# Patient Record
Sex: Female | Born: 1995 | Race: White | Hispanic: No | Marital: Single | Smoking: Former smoker
Health system: Southern US, Community
[De-identification: ages and names within clinical notes are randomized; demographics above are authoritative.]

---
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
## 2018-06-06 ENCOUNTER — Emergency Department (HOSPITAL_COMMUNITY)
Admission: EM | Admit: 2018-06-06 | Discharge: 2018-06-06 | Disposition: A | Discharge status: Home / Self Care | Payer: BLUE CROSS/BLUE SHIELD | Attending: Emergency Medicine | Admitting: Emergency Medicine

## 2018-06-06 ENCOUNTER — Other Ambulatory Visit: Payer: Self-pay

## 2018-06-06 ENCOUNTER — Encounter (HOSPITAL_COMMUNITY): Payer: Self-pay | Admitting: Emergency Medicine

## 2018-06-06 DIAGNOSIS — F17200 Nicotine dependence, unspecified, uncomplicated: Secondary | ICD-10-CM | POA: Diagnosis not present

## 2018-06-06 DIAGNOSIS — Y9289 Other specified places as the place of occurrence of the external cause: Secondary | ICD-10-CM | POA: Diagnosis not present

## 2018-06-06 DIAGNOSIS — W57XXXA Bitten or stung by nonvenomous insect and other nonvenomous arthropods, initial encounter: Secondary | ICD-10-CM | POA: Diagnosis not present

## 2018-06-06 DIAGNOSIS — S70361A Insect bite (nonvenomous), right thigh, initial encounter: Secondary | ICD-10-CM | POA: Insufficient documentation

## 2018-06-06 DIAGNOSIS — Y998 Other external cause status: Secondary | ICD-10-CM | POA: Insufficient documentation

## 2018-06-06 DIAGNOSIS — Y9389 Activity, other specified: Secondary | ICD-10-CM | POA: Insufficient documentation

## 2018-06-06 MED ORDER — IBUPROFEN 800 MG PO TABS
800.0000 mg | ORAL_TABLET | Freq: Once | ORAL | Status: AC
  Administered 2018-06-06: 800 mg via ORAL
  Filled 2018-06-06: qty 1

## 2018-06-06 MED ORDER — IBUPROFEN 800 MG PO TABS: 800.0000 mg | ORAL_TABLET | Freq: Three times a day (TID) | ORAL | 0 refills | Status: AC

## 2018-06-06 MED ORDER — SULFAMETHOXAZOLE-TRIMETHOPRIM 800-160 MG PO TABS: 1.0000 | ORAL_TABLET | Freq: Two times a day (BID) | ORAL | 0 refills | Status: AC

## 2018-06-06 MED ORDER — SULFAMETHOXAZOLE-TRIMETHOPRIM 800-160 MG PO TABS
1.0000 | ORAL_TABLET | Freq: Once | ORAL | Status: AC
  Administered 2018-06-06: 1 via ORAL
  Filled 2018-06-06: qty 1

## 2018-06-06 NOTE — Discharge Instructions (Addendum)
As discussed,  it is impossible to tell you what bit you at this time as this site looks inflamed and possibly could have a skin infection called cellullitis (giving that sunburned type appearance). I have given you information about brown recluse bites however, and would like you to get rechecked if you have any progressive symptoms as outlined. There is no specific treatment for this kind of bite unless you develop a wound requiring a wound care specialist.  Keep a close watch and get rechecked for any progressing symptoms as discussed.

## 2018-06-06 NOTE — ED Triage Notes (Signed)
Pt states she was bit by insect this am and now it is red around the bite.

## 2018-06-07 NOTE — ED Provider Notes (Signed)
[facility] EMERGENCY DEPARTMENT Provider Note   CSN: [id number] Arrival date & time: 06/06/18  2001     History   Chief Complaint Chief Complaint  Patient presents with  . Insect Bite    HPI Terri Day is a 22 y.o. female presenting for evaluation of an insect bite to her right upper anterior thigh occurring around 9 am today.  She was carrying goat feed from her car to the goat lot when she felt something bite her (was wearing shorts).  she dropped the feed bag brushing the insect off so was unable to identify the insect.  Since the event she has had expanding erythema mostly inferior to the bite site.  She denies fevers, chills, n/v, abdominal pain, denies rash, body aches or flu like symptoms.  She has had no medicines prior to arrival but she and family are concerned about possible brown recluse bite.  The history is provided by the patient and a friend.    History reviewed. No pertinent past medical history.  There are no active problems to display for this patient.   History reviewed. No pertinent surgical history.   OB History   None      Home Medications    Prior to Admission medications   Medication Sig Start Date End Date Taking? Authorizing Provider  ibuprofen (ADVIL,MOTRIN) 800 MG tablet Take 1 tablet (800 mg total) by mouth 3 (three) times daily. 06/06/18   Burgess Amor, PA-C  sulfamethoxazole-trimethoprim (BACTRIM DS,SEPTRA DS) 800-160 MG tablet Take 1 tablet by mouth 2 (two) times daily for 10 days. 06/06/18 06/16/18  Burgess Amor, PA-C    Family History History reviewed. No pertinent family history.  Social History Social History   Tobacco Use  . Smoking status: Current Every Day Smoker  . Smokeless tobacco: Never Used  Substance Use Topics  . Alcohol use: Yes  . Drug use: Never     Allergies   Patient has no allergy information on record.   Review of Systems Review of Systems  Constitutional: Negative for chills and fever.  Respiratory:  Negative for shortness of breath and wheezing.   Cardiovascular: Negative for chest pain.  Gastrointestinal: Negative for abdominal pain, nausea and vomiting.  Musculoskeletal: Negative for arthralgias, back pain, myalgias and neck pain.  Skin: Positive for color change and wound.  Neurological: Negative for dizziness, numbness and headaches.     Physical Exam Updated Vital Signs BP (!) 142/74 (BP Location: Right Arm)   Pulse 92   Temp 98.2 F (36.8 C) (Oral)   Resp 20   Ht 5\' 10"  (1.778 m)   Wt 74.8 kg (165 lb)   SpO2 100%   BMI 23.68 kg/m   Physical Exam  Constitutional: She appears well-developed and well-nourished. No distress.  HENT:  Head: Normocephalic.  Eyes: Conjunctivae are normal.  Neck: Normal range of motion. Neck supple.  Cardiovascular: Normal rate.  Pulmonary/Chest: Effort normal. She has no wheezes.  Abdominal: Soft. There is no tenderness.  Musculoskeletal: Normal range of motion. She exhibits no edema.  Skin: No rash noted. There is erythema.  11 x 17 cm area of warm erythema right anterior upper thigh. Tiny punctum at the superior edge of the erythema. No vesicle or pustule, no induration. Erythema blanches.  Well demarcated border. No red streaking.     ED Treatments / Results  Labs (all labs ordered are listed, but only abnormal results are displayed) Labs Reviewed - No data to display  EKG None  Radiology No results found.  Procedures Procedures (including critical care time)  Medications Ordered in ED Medications  ibuprofen (ADVIL,MOTRIN) tablet 800 mg (800 mg Oral Given 06/06/18 2100)  sulfamethoxazole-trimethoprim (BACTRIM DS,SEPTRA DS) 800-160 MG per tablet 1 tablet (1 tablet Oral Given 06/06/18 2100)     Initial Impression / Assessment and Plan / ED Course  I have reviewed the triage vital signs and the nursing notes.  Pertinent labs & imaging results that were available during my care of the patient were reviewed by me and  considered in my medical decision making (see chart for details).     Pt with insect bite of unknown variety.  Discussed the probability of this being a benign insect bite with localized inflammatory reaction, although could also be a cellulitis.  She was treated for this possibility, started on bactrim.  Also recommended ibuprofen, prescribed, and benadryl (pt has).  She was given information about signs to watch for including increasing redness, ulceration or formation of deeper wound/ulceration, Manson PasseyBrown Recluse bite info given. Advised there is no initial tx for a brown recluse bite except for localized wound care.   Advised recheck here for any new or worsened sx. Pt agreeable with plan.  Final Clinical Impressions(s) / ED Diagnoses   Final diagnoses:  Insect bite of right thigh, initial encounter    ED Discharge Orders        Ordered    sulfamethoxazole-trimethoprim (BACTRIM DS,SEPTRA DS) 800-160 MG tablet  2 times daily     06/06/18 2044    ibuprofen (ADVIL,MOTRIN) 800 MG tablet  3 times daily     06/06/18 2044       Burgess Amordol, Izaia Say, PA-C 06/07/18 [id number]    Long, Arlyss RepressJoshua G, MD 06/07/18 1204

## 2020-07-12 ENCOUNTER — Ambulatory Visit
Admission: EM | Admit: 2020-07-12 | Discharge: 2020-07-12 | Disposition: A | Discharge status: Home / Self Care | Payer: BC Managed Care – PPO | Attending: Emergency Medicine | Admitting: Emergency Medicine

## 2020-07-12 ENCOUNTER — Encounter: Payer: Self-pay | Admitting: Emergency Medicine

## 2020-07-12 ENCOUNTER — Other Ambulatory Visit: Payer: Self-pay

## 2020-07-12 DIAGNOSIS — R21 Rash and other nonspecific skin eruption: Secondary | ICD-10-CM

## 2020-07-12 DIAGNOSIS — L237 Allergic contact dermatitis due to plants, except food: Secondary | ICD-10-CM | POA: Diagnosis not present

## 2020-07-12 MED ORDER — PREDNISONE 20 MG PO TABS: 20.0000 mg | ORAL_TABLET | Freq: Two times a day (BID) | ORAL | 0 refills | Status: AC

## 2020-07-12 NOTE — Discharge Instructions (Signed)
Wash with warm water and mild soap Prednisone prescribed.  Take as directed and to completion Use OTC zyrtec, allegra, or claritin during the day.  Benadryl at night. You may also use OTC hydrocortisone cream and/or calamine lotion to help alleviate itching Follow up with PCP if symptoms persists  Return or go to the ED if you have any new or worsening symptoms such as fever, chills, nausea, vomiting, difficulty breathing, throat swelling, tongue swelling, numbness/ tingling in mouth, worsening symptoms despite treatment, etc...  

## 2020-07-12 NOTE — ED Provider Notes (Signed)
[facility]   [id number] 07/12/20 Arrival Time: 1517  CC: Rash  SUBJECTIVE:  Terri Day is a 24 y.o. female who presents with a poison ivy rash x 1 week.  Removed tree from road.  Localizes the rash to RT UE, RT chest, and RT knee.  Describes it as itchy, red and spreading.  Has tried multiple OTC medications without relief.  Symptoms are made worse with scratching.  Reports similar symptoms in the past.   Denies fever, chills, nausea, vomiting, discharge, dyspnea, oral swelling, SOB, chest pain, abdominal pain, changes in bowel or bladder function.    ROS: As per HPI.  All other pertinent ROS negative.     History reviewed. No pertinent past medical history. History reviewed. No pertinent surgical history. No Known Allergies No current facility-administered medications on file prior to encounter.   Current Outpatient Medications on File Prior to Encounter  Medication Sig Dispense Refill  . ibuprofen (ADVIL,MOTRIN) 800 MG tablet Take 1 tablet (800 mg total) by mouth 3 (three) times daily. 21 tablet 0   Social History   Socioeconomic History  . Marital status: Single    Spouse name: Not on file  . Number of children: Not on file  . Years of education: Not on file  . Highest education level: Not on file  Occupational History  . Not on file  Tobacco Use  . Smoking status: Current Every Day Smoker  . Smokeless tobacco: Never Used  Substance and Sexual Activity  . Alcohol use: Yes  . Drug use: Never  . Sexual activity: Not on file  Other Topics Concern  . Not on file  Social History Narrative  . Not on file   Social Determinants of Health   Financial Resource Strain:   . Difficulty of Paying Living Expenses:   Food Insecurity:   . Worried About Programme researcher, broadcasting/film/video in the Last Year:   . Barista in the Last Year:   Transportation Needs:   . Freight forwarder (Medical):   Marland Kitchen Lack of Transportation (Non-Medical):   Physical Activity:   . Days of  Exercise per Week:   . Minutes of Exercise per Session:   Stress:   . Feeling of Stress :   Social Connections:   . Frequency of Communication with Friends and Family:   . Frequency of Social Gatherings with Friends and Family:   . Attends Religious Services:   . Active Member of Clubs or Organizations:   . Attends Banker Meetings:   Marland Kitchen Marital Status:   Intimate Partner Violence:   . Fear of Current or Ex-Partner:   . Emotionally Abused:   Marland Kitchen Physically Abused:   . Sexually Abused:    History reviewed. No pertinent family history.  OBJECTIVE: Vitals:   07/12/20 1527  BP: (!) 124/86  Pulse: 92  Resp: 18  Temp: 98.6 F (37 C)  TempSrc: Oral  SpO2: 96%    General appearance: alert; no distress Head: NCAT Lungs: normal respiratory effort Extremities: no edema Skin: warm and dry; areas of linear papules and vesicles with surrounding erythema to RUE Psychological: alert and cooperative; normal mood and affect  ASSESSMENT & PLAN:  1. Poison ivy dermatitis   2. Rash and nonspecific skin eruption     Meds ordered this encounter  Medications  . predniSONE (DELTASONE) 20 MG tablet    Sig: Take 1 tablet (20 mg total) by mouth 2 (two) times daily with a meal  for 5 days.    Dispense:  10 tablet    Refill:  0    Order Specific Question:   Supervising Provider    Answer:   Eustace Moore [[id number]]   Wash with warm water and mild soap Prednisone prescribed.  Take as directed and to completion Use OTC zyrtec, allegra, or claritin during the day.  Benadryl at night. You may also use OTC hydrocortisone cream and/or calamine lotion to help alleviate itching Follow up with PCP if symptoms persists  Return or go to the ED if you have any new or worsening symptoms such as fever, chills, nausea, vomiting, difficulty breathing, throat swelling, tongue swelling, numbness/ tingling in mouth, worsening symptoms despite treatment, etc...   Reviewed expectations re: course  of current medical issues. Questions answered. Outlined signs and symptoms indicating need for more acute intervention. Patient verbalized understanding. After Visit Summary given.   Rennis Harding, PA-C 07/12/20 1532

## 2020-07-12 NOTE — ED Triage Notes (Signed)
Pt here for rash from poison ivy to arms and leg

## 2021-12-30 ENCOUNTER — Other Ambulatory Visit: Payer: Self-pay

## 2021-12-30 ENCOUNTER — Emergency Department (HOSPITAL_COMMUNITY)
Admission: EM | Admit: 2021-12-30 | Discharge: 2021-12-30 | Disposition: A | Discharge status: Home / Self Care | Payer: BC Managed Care – PPO | Attending: Emergency Medicine | Admitting: Emergency Medicine

## 2021-12-30 ENCOUNTER — Encounter (HOSPITAL_COMMUNITY): Payer: Self-pay | Admitting: Emergency Medicine

## 2021-12-30 DIAGNOSIS — Z87891 Personal history of nicotine dependence: Secondary | ICD-10-CM | POA: Insufficient documentation

## 2021-12-30 DIAGNOSIS — N7689 Other specified inflammation of vagina and vulva: Secondary | ICD-10-CM | POA: Diagnosis not present

## 2021-12-30 DIAGNOSIS — N9489 Other specified conditions associated with female genital organs and menstrual cycle: Secondary | ICD-10-CM

## 2021-12-30 DIAGNOSIS — R1909 Other intra-abdominal and pelvic swelling, mass and lump: Secondary | ICD-10-CM | POA: Diagnosis present

## 2021-12-30 NOTE — Discharge Instructions (Signed)
You were evaluated in the Emergency Department and after careful evaluation, we did not find any emergent condition requiring admission or further testing in the hospital.  Your exam/testing today was overall reassuring.  Please return to the Emergency Department if you experience any worsening of your condition.  Thank you for allowing us to be a part of your care.  

## 2021-12-30 NOTE — ED Triage Notes (Signed)
Pt c/o left side of vagina x 2 days.

## 2021-12-30 NOTE — ED Provider Notes (Signed)
AP-EMERGENCY DEPT [facility] Emergency Department Provider Note MRN:  [id number]  Arrival date & time: 12/30/21     Chief Complaint   Groin Swelling   History of Present Illness   Terri Day is a 26 y.o. year-old female with no pertinent past medical history presenting to the ED with chief complaint of growing swollen.  Swelling to the left labia today.  Also had some swelling to the mons region but this has improved.  The labia swelling has improved over the past few hours.  Explains that the symptoms occurred after returning from the gym.  She was doing some hip thrust exercises with heavy weights on her lap area and thinks this may be related.  Denies any vaginal bleeding or discharge, no fever, no pain at this time.  Review of Systems  A thorough review of systems was obtained and all systems are negative except as noted in the HPI and PMH.   Patient's Health History   History reviewed. No pertinent past medical history.  History reviewed. No pertinent surgical history.  No family history on file.  Social History   Socioeconomic History   Marital status: Single    Spouse name: Not on file   Number of children: Not on file   Years of education: Not on file   Highest education level: Not on file  Occupational History   Not on file  Tobacco Use   Smoking status: Former    Types: Cigarettes   Smokeless tobacco: Never  Vaping Use   Vaping Use: Every day  Substance and Sexual Activity   Alcohol use: Yes    Comment: occ   Drug use: Never   Sexual activity: Not on file  Other Topics Concern   Not on file  Social History Narrative   Not on file   Social Determinants of Health   Financial Resource Strain: Not on file  Food Insecurity: Not on file  Transportation Needs: Not on file  Physical Activity: Not on file  Stress: Not on file  Social Connections: Not on file  Intimate Partner Violence: Not on file     Physical Exam  There were no vitals filed for  this visit.  CONSTITUTIONAL: Well-appearing, NAD NEURO/PSYCH:  Alert and oriented x 3, no focal deficits EYES:  eyes equal and reactive ENT/NECK:  no LAD, no JVD CARDIO: Regular rate, well-perfused, normal S1 and S2 PULM:  CTAB no wheezing or rhonchi GI/GU:  non-distended, non-tender MSK/SPINE:  No gross deformities, no edema SKIN:  no rash, atraumatic   *Additional and/or pertinent findings included in MDM below  Diagnostic and Interventional Summary    EKG Interpretation  Date/Time:    Ventricular Rate:    PR Interval:    QRS Duration:   QT Interval:    QTC Calculation:   R Axis:     Text Interpretation:         Labs Reviewed - No data to display  No orders to display    Medications - No data to display   Procedures  /  Critical Care Procedures  ED Course and Medical Decision Making  Initial Impression and Ddx Suspect some mild swelling due to irritation from the exercise earlier in the day.  The external genitalia overall appears normal.  The left labia minora is mildly larger than the right with some mild hyperpigmentation/redness but does not seem consistent with cellulitis or infection.  There is no tenderness, there is no fluctuance, highly doubt labial abscess.  No other symptoms, no fever.  Past medical/surgical history that increases complexity of ED encounter: None  Interpretation of Diagnostics     N/A  Patient Reassessment and Ultimate Disposition/Management Patient is provided with reassurance, we explained the possibility of an early infection and so she will come back if symptoms worsen, otherwise she will monitor the situation, use cold compresses as needed.  Discharge home.  Patient management required discussion with the following services or consulting groups:  None  Complexity of Problems Addressed Acute uncomplicated illness or injury with no diagnostics  Additional Data Reviewed and Analyzed Further history obtained from: Past medical  history and medications listed in the EMR  Patient Encounter Risk Assessment Low  Elmer Sow. Pilar Plate, MD [facility] Emergency Medicine [facility] [email or web address]  Final Clinical Impressions(s) / ED Diagnoses     ICD-10-CM   1. Labial swelling  N94.89       ED Discharge Orders     None        Discharge Instructions Discussed with and Provided to Patient:    Discharge Instructions      You were evaluated in the Emergency Department and after careful evaluation, we did not find any emergent condition requiring admission or further testing in the hospital.  Your exam/testing today was overall reassuring.  Please return to the Emergency Department if you experience any worsening of your condition.  Thank you for allowing [place] to be a part of your care.       Sabas Sous, MD 12/30/21 [phone]

## 2023-06-20 DIAGNOSIS — Z419 Encounter for procedure for purposes other than remedying health state, unspecified: Secondary | ICD-10-CM | POA: Diagnosis not present

## 2023-07-01 DIAGNOSIS — Z3042 Encounter for surveillance of injectable contraceptive: Secondary | ICD-10-CM | POA: Diagnosis not present

## 2023-07-21 DIAGNOSIS — Z419 Encounter for procedure for purposes other than remedying health state, unspecified: Secondary | ICD-10-CM | POA: Diagnosis not present

## 2023-08-21 DIAGNOSIS — Z419 Encounter for procedure for purposes other than remedying health state, unspecified: Secondary | ICD-10-CM | POA: Diagnosis not present

## 2023-09-16 DIAGNOSIS — Z3042 Encounter for surveillance of injectable contraceptive: Secondary | ICD-10-CM | POA: Diagnosis not present

## 2023-09-20 DIAGNOSIS — Z419 Encounter for procedure for purposes other than remedying health state, unspecified: Secondary | ICD-10-CM | POA: Diagnosis not present

## 2023-10-21 DIAGNOSIS — Z419 Encounter for procedure for purposes other than remedying health state, unspecified: Secondary | ICD-10-CM | POA: Diagnosis not present

## 2023-11-20 DIAGNOSIS — Z419 Encounter for procedure for purposes other than remedying health state, unspecified: Secondary | ICD-10-CM | POA: Diagnosis not present

## 2023-12-09 DIAGNOSIS — Z3042 Encounter for surveillance of injectable contraceptive: Secondary | ICD-10-CM | POA: Diagnosis not present

## 2023-12-21 DIAGNOSIS — Z419 Encounter for procedure for purposes other than remedying health state, unspecified: Secondary | ICD-10-CM | POA: Diagnosis not present

## 2024-01-21 DIAGNOSIS — Z419 Encounter for procedure for purposes other than remedying health state, unspecified: Secondary | ICD-10-CM | POA: Diagnosis not present

## 2024-02-13 DIAGNOSIS — Z1321 Encounter for screening for nutritional disorder: Secondary | ICD-10-CM | POA: Diagnosis not present

## 2024-02-13 DIAGNOSIS — Z1329 Encounter for screening for other suspected endocrine disorder: Secondary | ICD-10-CM | POA: Diagnosis not present

## 2024-02-13 DIAGNOSIS — Z0000 Encounter for general adult medical examination without abnormal findings: Secondary | ICD-10-CM | POA: Diagnosis not present

## 2024-02-13 DIAGNOSIS — Z130 Encounter for screening for diseases of the blood and blood-forming organs and certain disorders involving the immune mechanism: Secondary | ICD-10-CM | POA: Diagnosis not present

## 2024-02-13 DIAGNOSIS — Z13228 Encounter for screening for other metabolic disorders: Secondary | ICD-10-CM | POA: Diagnosis not present

## 2024-02-13 DIAGNOSIS — Z2820 Immunization not carried out because of patient decision for unspecified reason: Secondary | ICD-10-CM | POA: Diagnosis not present

## 2024-02-13 DIAGNOSIS — E7800 Pure hypercholesterolemia, unspecified: Secondary | ICD-10-CM | POA: Diagnosis not present

## 2024-02-18 DIAGNOSIS — Z419 Encounter for procedure for purposes other than remedying health state, unspecified: Secondary | ICD-10-CM | POA: Diagnosis not present

## 2024-03-02 DIAGNOSIS — Z3042 Encounter for surveillance of injectable contraceptive: Secondary | ICD-10-CM | POA: Diagnosis not present

## 2024-03-26 DIAGNOSIS — B351 Tinea unguium: Secondary | ICD-10-CM | POA: Diagnosis not present

## 2024-03-31 DIAGNOSIS — Z419 Encounter for procedure for purposes other than remedying health state, unspecified: Secondary | ICD-10-CM | POA: Diagnosis not present

## 2024-04-30 DIAGNOSIS — Z419 Encounter for procedure for purposes other than remedying health state, unspecified: Secondary | ICD-10-CM | POA: Diagnosis not present

## 2024-05-23 DIAGNOSIS — Z6829 Body mass index (BMI) 29.0-29.9, adult: Secondary | ICD-10-CM | POA: Diagnosis not present

## 2024-05-23 DIAGNOSIS — Z0000 Encounter for general adult medical examination without abnormal findings: Secondary | ICD-10-CM | POA: Diagnosis not present

## 2024-05-31 DIAGNOSIS — Z419 Encounter for procedure for purposes other than remedying health state, unspecified: Secondary | ICD-10-CM | POA: Diagnosis not present

## 2024-06-30 DIAGNOSIS — Z419 Encounter for procedure for purposes other than remedying health state, unspecified: Secondary | ICD-10-CM | POA: Diagnosis not present

## 2024-07-31 DIAGNOSIS — Z419 Encounter for procedure for purposes other than remedying health state, unspecified: Secondary | ICD-10-CM | POA: Diagnosis not present

## 2024-08-15 DIAGNOSIS — Z3042 Encounter for surveillance of injectable contraceptive: Secondary | ICD-10-CM | POA: Diagnosis not present

## 2024-08-31 DIAGNOSIS — Z419 Encounter for procedure for purposes other than remedying health state, unspecified: Secondary | ICD-10-CM | POA: Diagnosis not present

## 2024-11-21 DIAGNOSIS — Z3042 Encounter for surveillance of injectable contraceptive: Secondary | ICD-10-CM | POA: Diagnosis not present
